# Patient Record
Sex: Female | Born: 1965 | Race: White | Hispanic: No | Smoking: Never smoker
Health system: Southern US, Community
[De-identification: ages and names within clinical notes are randomized; demographics above are authoritative.]

## PROBLEM LIST (undated history)

## (undated) DIAGNOSIS — F419 Anxiety disorder, unspecified: Secondary | ICD-10-CM

---

## 2020-10-04 ENCOUNTER — Emergency Department (HOSPITAL_BASED_OUTPATIENT_CLINIC_OR_DEPARTMENT_OTHER): Payer: Worker's Compensation

## 2020-10-04 ENCOUNTER — Emergency Department (HOSPITAL_BASED_OUTPATIENT_CLINIC_OR_DEPARTMENT_OTHER)
Admission: EM | Admit: 2020-10-04 | Discharge: 2020-10-04 | Disposition: A | Discharge status: Home / Self Care | Payer: Worker's Compensation | Attending: Emergency Medicine | Admitting: Emergency Medicine

## 2020-10-04 ENCOUNTER — Encounter (HOSPITAL_BASED_OUTPATIENT_CLINIC_OR_DEPARTMENT_OTHER): Payer: Self-pay | Admitting: Emergency Medicine

## 2020-10-04 DIAGNOSIS — M546 Pain in thoracic spine: Secondary | ICD-10-CM | POA: Insufficient documentation

## 2020-10-04 DIAGNOSIS — Y990 Civilian activity done for income or pay: Secondary | ICD-10-CM | POA: Insufficient documentation

## 2020-10-04 DIAGNOSIS — X509XXA Other and unspecified overexertion or strenuous movements or postures, initial encounter: Secondary | ICD-10-CM | POA: Insufficient documentation

## 2020-10-04 HISTORY — DX: Anxiety disorder, unspecified: F41.9

## 2020-10-04 MED ORDER — KETOROLAC TROMETHAMINE 60 MG/2ML IM SOLN
30.0000 mg | Freq: Once | INTRAMUSCULAR | Status: AC
  Administered 2020-10-04: 30 mg via INTRAMUSCULAR
  Filled 2020-10-04: qty 2

## 2020-10-04 MED ORDER — CYCLOBENZAPRINE HCL 10 MG PO TABS
10.0000 mg | ORAL_TABLET | Freq: Once | ORAL | Status: AC
  Administered 2020-10-04: 10 mg via ORAL
  Filled 2020-10-04: qty 1

## 2020-10-04 MED ORDER — CYCLOBENZAPRINE HCL 10 MG PO TABS: 10.0000 mg | ORAL_TABLET | Freq: Two times a day (BID) | ORAL | 0 refills | Status: AC | PRN

## 2020-10-04 MED ORDER — LIDOCAINE 5 % EX PTCH
1.0000 | MEDICATED_PATCH | CUTANEOUS | Status: DC
  Administered 2020-10-04: 1 via TRANSDERMAL
  Filled 2020-10-04: qty 1

## 2020-10-04 MED ORDER — ACETAMINOPHEN 500 MG PO TABS
1000.0000 mg | ORAL_TABLET | Freq: Once | ORAL | Status: AC
  Administered 2020-10-04: 1000 mg via ORAL
  Filled 2020-10-04: qty 2

## 2020-10-04 NOTE — ED Provider Notes (Signed)
[facility] EMERGENCY DEPT Provider Note   CSN: [id number] Arrival date & time: 10/04/20  1427     History Chief Complaint  Patient presents with   Back Pain    Cynthia Richmond is a 55 y.o. female.   Back Pain Associated symptoms: no abdominal pain, no chest pain, no dysuria, no fever, no headaches, no numbness and no weakness   Patient is a 56 year old female who presents for back pain.  She had an injury at work about 10 days ago.  At that time, she fell onto the left side of her thoracic back.  She had some bruising and abrasion at that time.  Earlier today, she was doing some physical labor she went to push herself up out of a ditch, simulating a push-up movement.  At that time, she felt multiple pops in the same area of her recent injury.  She has since had a sharp pain at that location.  Pain is worsened with deep inspiration.  She has not tried any new medication for her pain.  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  Patient denies any other areas of discomfort.      Past Medical History:  Diagnosis Date   Anxiety     There are no problems to display for this patient.    OB History   No obstetric history on file.     No family history on file.     Home Medications Prior to Admission medications   Medication Sig Start Date End Date Taking? Authorizing Provider  cyclobenzaprine (FLEXERIL) 10 MG tablet Take 1 tablet (10 mg total) by mouth 2 (two) times daily as needed for up to 3 days for muscle spasms. 10/04/20 10/07/20 Yes Gloris Manchester, MD    Allergies    Patient has no known allergies.  Review of Systems   Review of Systems  Constitutional:  Negative for chills and fever.  HENT:  Negative for ear pain and sore throat.   Eyes:  Negative for pain and visual disturbance.  Respiratory:  Negative for cough, wheezing and stridor.   Cardiovascular:  Negative for chest pain and palpitations.   Gastrointestinal:  Negative for abdominal pain and vomiting.  Genitourinary:  Negative for dysuria and hematuria.  Musculoskeletal:  Positive for back pain. Negative for arthralgias, gait problem, joint swelling and neck pain.  Skin:  Negative for color change, rash and wound.  Neurological:  Negative for dizziness, seizures, syncope, weakness, light-headedness, numbness and headaches.  All other systems reviewed and are negative.  Physical Exam Updated Vital Signs BP (!) 136/96 (BP Location: Left Arm)   Pulse 61   Temp 98 F (36.7 C) (Oral)   Resp 16   Ht 5\' 11"  (1.803 m)   Wt 77.1 kg   SpO2 100%   BMI 23.71 kg/m   Physical Exam Vitals and nursing note reviewed.  Constitutional:      General: She is not in acute distress.    Appearance: Normal appearance. She is well-developed and normal weight. She is not ill-appearing, toxic-appearing or diaphoretic.  HENT:     Head: Normocephalic and atraumatic.     Right Ear: External ear normal.     Left Ear: External ear normal.     Nose: Nose normal.  Eyes:     Conjunctiva/sclera: Conjunctivae normal.  Cardiovascular:     Rate and Rhythm: Normal rate and regular rhythm.     Heart sounds: No  murmur heard. Pulmonary:     Effort: Pulmonary effort is normal. No respiratory distress.     Breath sounds: Normal breath sounds. No wheezing or rhonchi.  Abdominal:     Palpations: Abdomen is soft.     Tenderness: There is no abdominal tenderness.  Musculoskeletal:        General: Tenderness (Area of injury (left lower thoracic)) present.     Cervical back: Normal range of motion and neck supple.  Skin:    General: Skin is warm and dry.     Coloration: Skin is not jaundiced or pale.  Neurological:     General: No focal deficit present.     Mental Status: She is alert and oriented to person, place, and time.     Cranial Nerves: No cranial nerve deficit.     Sensory: No sensory deficit.     Motor: No weakness.  Psychiatric:         Mood and Affect: Mood normal.        Behavior: Behavior normal.        Thought Content: Thought content normal.        Judgment: Judgment normal.    ED Results / Procedures / Treatments   Labs (all labs ordered are listed, but only abnormal results are displayed) Labs Reviewed - No data to display  EKG None  Radiology DG Chest 2 View  Result Date: 10/04/2020 CLINICAL DATA:  Felt a pop in thoracic area when bending down. Shortness of breath. EXAM: CHEST - 2 VIEW COMPARISON:  None. FINDINGS: The heart size and mediastinal contours are within normal limits. Both lungs are clear. The visualized skeletal structures are unremarkable. IMPRESSION: No active cardiopulmonary disease. Electronically Signed   By: Gerome Sam III M.D.   On: 10/04/2020 15:21    Procedures Procedures   Medications Ordered in ED Medications  ketorolac (TORADOL) injection 30 mg (30 mg Intramuscular Given 10/04/20 1953)  acetaminophen (TYLENOL) tablet 1,000 mg (1,000 mg Oral Given 10/04/20 1953)  cyclobenzaprine (FLEXERIL) tablet 10 mg (10 mg Oral Given 10/04/20 1953)    ED Course  I have reviewed the triage vital signs and the nursing notes.  Pertinent labs & imaging results that were available during my care of the patient were reviewed by me and considered in my medical decision making (see chart for details).    MDM Rules/Calculators/A&P                           Patient presents for left thoracic back pain.  She had a previous injury from a fall 10 days ago.  Today, she was doing a push-up movement while climbing out of a ditch and felt a pop in that same location.  She presents today for persistent pain throughout the day.  On exam, patient is well-appearing.  She is sitting on the edge of ED bed and restricting movement to avoid worsening of her thoracic left-sided back pain.  Area is tenderness to palpation.  No swelling is appreciated.  Lungs are clear to auscultation.  Prior to being bedded in the ED,  x-ray imaging was obtained which did not show any osseous abnormalities.  Patient's pain likely in muscles/connective tissue.  Will treat with analgesia.  Patient also requested a work note, as her job requires physically intensive labor.  Patient was given lidocaine patch, Flexeril, Toradol, and Tylenol.  Work note and prescription for Flexeril, to be taken as needed, were provided.  Patient was discharged in good condition.  Final Clinical Impression(s) / ED Diagnoses Final diagnoses:  Acute left-sided thoracic back pain    Rx / DC Orders ED Discharge Orders          Ordered    cyclobenzaprine (FLEXERIL) 10 MG tablet  2 times daily PRN        10/04/20 2010             Gloris Manchester, MD 10/05/20 0202

## 2020-10-04 NOTE — ED Triage Notes (Signed)
She bend down today and felt a pop to L thoracic back area and now reports SOB. Concerned about a cracked rib.

## 2021-12-12 IMAGING — DX DG CHEST 2V
2 series · 2 of 2 positions shown · non-contrast
Comparison: None.

CLINICAL DATA: Felt a pop in thoracic area when bending down.
Shortness of breath.

EXAM:
CHEST - 2 VIEW

[chest lat]
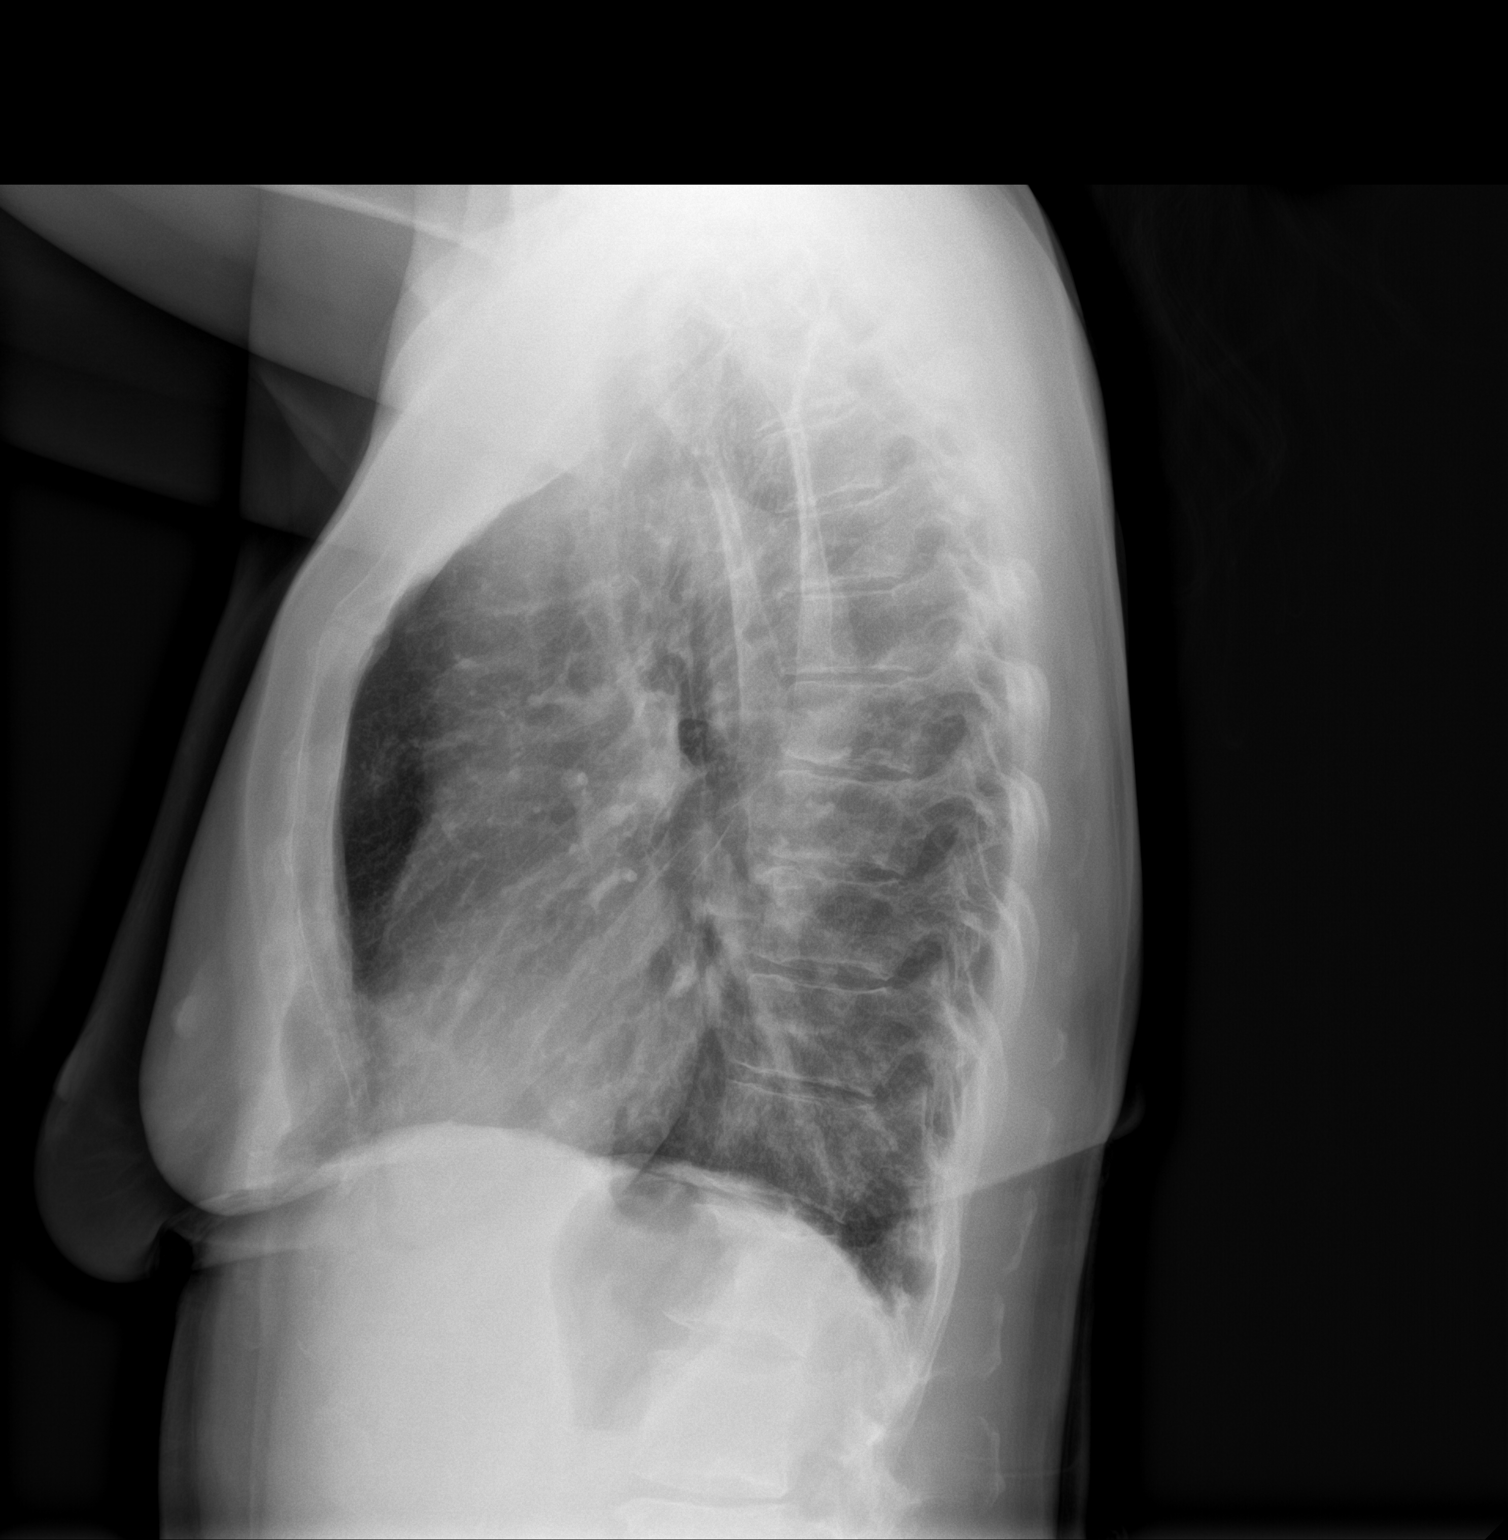

[chest ap]
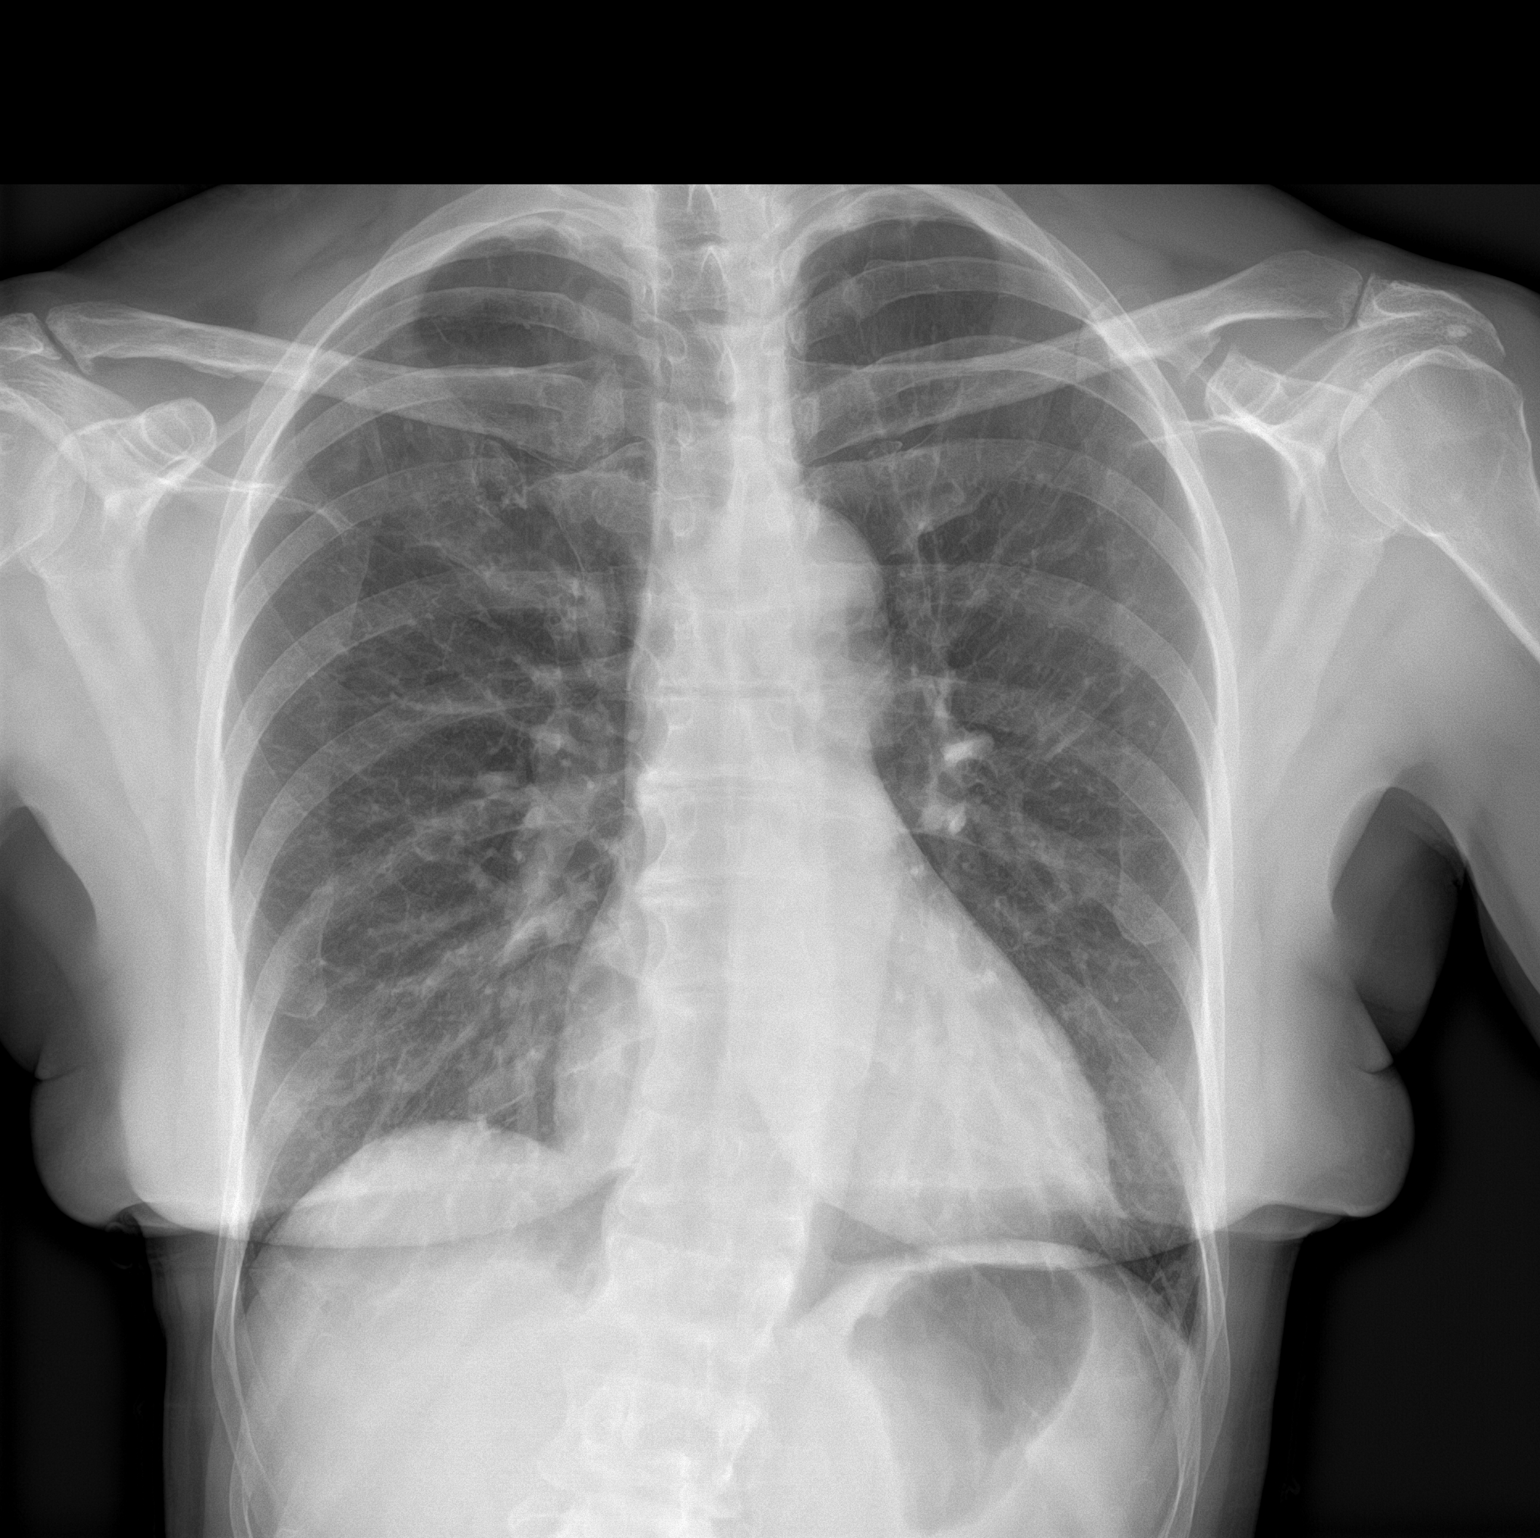

[2 of 2 positions shown; findings below may reference images not displayed]

FINDINGS: The heart size and mediastinal contours are within normal limits.
Both lungs are clear. The visualized skeletal structures are
unremarkable.
IMPRESSION: No active cardiopulmonary disease.

## 2022-01-14 ENCOUNTER — Other Ambulatory Visit (HOSPITAL_COMMUNITY): Payer: Self-pay

## 2022-01-14 ENCOUNTER — Other Ambulatory Visit: Payer: Self-pay

## 2022-01-14 ENCOUNTER — Emergency Department (HOSPITAL_COMMUNITY): Payer: BC Managed Care – PPO

## 2022-01-14 ENCOUNTER — Inpatient Hospital Stay (HOSPITAL_COMMUNITY)
Admission: EM | Admit: 2022-01-14 | Discharge: 2022-01-16 | DRG: 605 | Disposition: A | Discharge status: Home / Self Care | Payer: BC Managed Care – PPO | Attending: Surgery | Admitting: Surgery

## 2022-01-14 DIAGNOSIS — S300XXA Contusion of lower back and pelvis, initial encounter: Principal | ICD-10-CM | POA: Diagnosis present

## 2022-01-14 DIAGNOSIS — Z885 Allergy status to narcotic agent status: Secondary | ICD-10-CM | POA: Diagnosis not present

## 2022-01-14 DIAGNOSIS — W208XXA Other cause of strike by thrown, projected or falling object, initial encounter: Secondary | ICD-10-CM | POA: Diagnosis present

## 2022-01-14 DIAGNOSIS — S301XXA Contusion of abdominal wall, initial encounter: Secondary | ICD-10-CM | POA: Diagnosis present

## 2022-01-14 DIAGNOSIS — T148XXA Other injury of unspecified body region, initial encounter: Secondary | ICD-10-CM | POA: Diagnosis present

## 2022-01-14 LAB — CBC WITH DIFFERENTIAL/PLATELET
Abs Immature Granulocytes: 0.03 10*3/uL (ref 0.00–0.07)
Basophils Absolute: 0 10*3/uL (ref 0.0–0.1)
Basophils Relative: 0 %
Eosinophils Absolute: 0 10*3/uL (ref 0.0–0.5)
Eosinophils Relative: 0 %
HCT: 36.5 % (ref 36.0–46.0)
Hemoglobin: 12.5 g/dL (ref 12.0–15.0)
Immature Granulocytes: 0 %
Lymphocytes Relative: 7 %
Lymphs Abs: 0.7 10*3/uL (ref 0.7–4.0)
MCH: 32.6 pg (ref 26.0–34.0)
MCHC: 34.2 g/dL (ref 30.0–36.0)
MCV: 95.3 fL (ref 80.0–100.0)
Monocytes Absolute: 0.5 10*3/uL (ref 0.1–1.0)
Monocytes Relative: 5 %
Neutro Abs: 8.1 10*3/uL — ABNORMAL HIGH (ref 1.7–7.7)
Neutrophils Relative %: 88 %
Platelets: 184 10*3/uL (ref 150–400)
RBC: 3.83 MIL/uL — ABNORMAL LOW (ref 3.87–5.11)
RDW: 12.6 % (ref 11.5–15.5)
WBC: 9.4 10*3/uL (ref 4.0–10.5)
nRBC: 0 % (ref 0.0–0.2)

## 2022-01-14 LAB — COMPREHENSIVE METABOLIC PANEL
ALT: 17 U/L (ref 0–44)
AST: 22 U/L (ref 15–41)
Albumin: 3.9 g/dL (ref 3.5–5.0)
Alkaline Phosphatase: 51 U/L (ref 38–126)
Anion gap: 12 (ref 5–15)
BUN: 31 mg/dL — ABNORMAL HIGH (ref 6–20)
CO2: 24 mmol/L (ref 22–32)
Calcium: 9.2 mg/dL (ref 8.9–10.3)
Chloride: 104 mmol/L (ref 98–111)
Creatinine, Ser: 0.68 mg/dL (ref 0.44–1.00)
GFR, Estimated: >60 mL/min (ref >60)
Glucose, Bld: 142 mg/dL — ABNORMAL HIGH (ref 70–99)
Potassium: 3.6 mmol/L (ref 3.5–5.1)
Sodium: 140 mmol/L (ref 135–145)
Total Bilirubin: 0.6 mg/dL (ref 0.3–1.2)
Total Protein: 6.9 g/dL (ref 6.5–8.1)

## 2022-01-14 LAB — I-STAT CHEM 8, ED
BUN: 30 mg/dL — ABNORMAL HIGH (ref 6–20)
Calcium, Ion: 1.1 mmol/L — ABNORMAL LOW (ref 1.15–1.40)
Chloride: 106 mmol/L (ref 98–111)
Creatinine, Ser: 0.6 mg/dL (ref 0.44–1.00)
Glucose, Bld: 141 mg/dL — ABNORMAL HIGH (ref 70–99)
HCT: 39 % (ref 36.0–46.0)
Hemoglobin: 13.3 g/dL (ref 12.0–15.0)
Potassium: 3.6 mmol/L (ref 3.5–5.1)
Sodium: 139 mmol/L (ref 135–145)
TCO2: 21 mmol/L — ABNORMAL LOW (ref 22–32)

## 2022-01-14 MED ORDER — FENTANYL CITRATE PF 50 MCG/ML IJ SOSY: 50.0000 ug | PREFILLED_SYRINGE | Freq: Once | INTRAMUSCULAR | Status: DC

## 2022-01-14 MED ORDER — ONDANSETRON HCL 4 MG/2ML IJ SOLN: 4.0000 mg | Freq: Four times a day (QID) | INTRAMUSCULAR | Status: DC | PRN

## 2022-01-14 MED ORDER — ACETAMINOPHEN 500 MG PO TABS
1000.0000 mg | ORAL_TABLET | Freq: Four times a day (QID) | ORAL | Status: DC
  Administered 2022-01-15 – 2022-01-16 (×6): 1000 mg via ORAL
  Filled 2022-01-14 (×6): qty 2

## 2022-01-14 MED ORDER — SODIUM CHLORIDE 0.9 % IV BOLUS
1000.0000 mL | Freq: Once | INTRAVENOUS | Status: AC
  Administered 2022-01-14: 1000 mL via INTRAVENOUS

## 2022-01-14 MED ORDER — OXYCODONE HCL 5 MG PO TABS
5.0000 mg | ORAL_TABLET | ORAL | Status: DC | PRN
  Administered 2022-01-16: 5 mg via ORAL
  Filled 2022-01-14: qty 1

## 2022-01-14 MED ORDER — ONDANSETRON 4 MG PO TBDP: 4.0000 mg | ORAL_TABLET | Freq: Four times a day (QID) | ORAL | Status: DC | PRN

## 2022-01-14 MED ORDER — IOHEXOL 350 MG/ML SOLN
75.0000 mL | Freq: Once | INTRAVENOUS | Status: AC | PRN
  Administered 2022-01-14: 75 mL via INTRAVENOUS

## 2022-01-14 MED ORDER — HYDRALAZINE HCL 20 MG/ML IJ SOLN: 10.0000 mg | INTRAMUSCULAR | Status: DC | PRN

## 2022-01-14 MED ORDER — HYDROMORPHONE HCL 1 MG/ML IJ SOLN
1.0000 mg | Freq: Once | INTRAMUSCULAR | Status: AC
  Administered 2022-01-14: 1 mg via INTRAVENOUS
  Filled 2022-01-14: qty 1

## 2022-01-14 MED ORDER — ONDANSETRON HCL 4 MG/2ML IJ SOLN
4.0000 mg | Freq: Once | INTRAMUSCULAR | Status: AC
  Administered 2022-01-14: 4 mg via INTRAVENOUS
  Filled 2022-01-14: qty 2

## 2022-01-14 MED ORDER — OXYCODONE HCL 5 MG PO TABS
10.0000 mg | ORAL_TABLET | ORAL | Status: DC | PRN
  Administered 2022-01-15 – 2022-01-16 (×2): 10 mg via ORAL
  Filled 2022-01-14 (×2): qty 2

## 2022-01-14 MED ORDER — DOCUSATE SODIUM 100 MG PO CAPS
100.0000 mg | ORAL_CAPSULE | Freq: Two times a day (BID) | ORAL | Status: DC
  Administered 2022-01-15: 100 mg via ORAL
  Filled 2022-01-14 (×2): qty 1

## 2022-01-14 MED ORDER — DIAZEPAM 5 MG/ML IJ SOLN
5.0000 mg | Freq: Once | INTRAMUSCULAR | Status: AC
  Administered 2022-01-14: 5 mg via INTRAVENOUS
  Filled 2022-01-14: qty 2

## 2022-01-14 MED ORDER — MELATONIN 3 MG PO TABS
3.0000 mg | ORAL_TABLET | Freq: Every evening | ORAL | Status: DC | PRN
  Administered 2022-01-15: 3 mg via ORAL
  Filled 2022-01-14: qty 1

## 2022-01-14 MED ORDER — HYDROMORPHONE HCL 1 MG/ML IJ SOLN
0.5000 mg | INTRAMUSCULAR | Status: DC | PRN
  Administered 2022-01-15: 0.5 mg via INTRAVENOUS
  Filled 2022-01-14: qty 1

## 2022-01-14 NOTE — ED Notes (Signed)
Patient is resting comfortably. 

## 2022-01-14 NOTE — ED Notes (Signed)
Pt does not want to change into a gown at this time

## 2022-01-14 NOTE — ED Notes (Signed)
Patient allowed staff to remove bra and pants, placed gown on patient. Patient appears comfortable at this time. Patient updated about plan of care and NPO at this time.

## 2022-01-14 NOTE — ED Provider Notes (Signed)
[facility] EMERGENCY DEPARTMENT Provider Note   CSN: [id number] Arrival date & time: 01/14/22  1918     History  Chief Complaint  Patient presents with   Back Injury    Martena Emanuele is a 56 y.o. female here presenting with back injury.  Patient states that she had some people cut down trees at her house.  She states that one of the straps is loose so she went over to tell the workers and the [place] apparently broke and the branch fell and hit her on the right side of her back and ribs area.  She noticed progressive swelling to the right hip and back area.  She states that she is in so much pain that she eventually could not walk due to pain.  She states that she was able to walk initially after the injury.  Denies any head injury.  The history is provided by the patient.       Home Medications Prior to Admission medications   Not on File      Allergies    Hydrocodone, Codeine, and Povidone-iodine    Review of Systems   Review of Systems  Musculoskeletal:        Right flank pain  All other systems reviewed and are negative.   Physical Exam Updated Vital Signs BP 135/81   Pulse 73   Temp 98.7 F (37.1 C) (Oral)   Resp 18   Ht 5\' 11"  (1.803 m)   Wt 82.6 kg   SpO2 99%   BMI 25.38 kg/m  Physical Exam Vitals and nursing note reviewed.  Constitutional:      Comments: Uncomfortable,  HENT:     Head: Normocephalic and atraumatic.     Comments: No signs of head trauma    Nose: Nose normal.     Mouth/Throat:     Mouth: Mucous membranes are moist.  Eyes:     Extraocular Movements: Extraocular movements intact.     Pupils: Pupils are equal, round, and reactive to light.  Cardiovascular:     Rate and Rhythm: Normal rate and regular rhythm.     Pulses: Normal pulses.     Heart sounds: Normal heart sounds.  Pulmonary:     Effort: Pulmonary effort is normal.     Breath sounds: Normal breath sounds.     Comments: Tenderness in the right  lower ribs Abdominal:     General: Abdomen is flat.     Palpations: Abdomen is soft.  Musculoskeletal:     Cervical back: Normal range of motion and neck supple.     Comments: Patient has tenderness in the right gluteus area and also right flank area.  Patient has pain with range of motion of the hip but is able to range the hip.  Skin:    General: Skin is warm.     Capillary Refill: Capillary refill takes less than 2 seconds.  Neurological:     General: No focal deficit present.     Mental Status: She is oriented to person, place, and time.  Psychiatric:        Mood and Affect: Mood normal.        Behavior: Behavior normal.     ED Results / Procedures / Treatments   Labs (all labs ordered are listed, but only abnormal results are displayed) Labs Reviewed  CBC WITH DIFFERENTIAL/PLATELET - Abnormal; Notable for the following components:      Result Value   RBC 3.83 (*)  Neutro Abs 8.1 (*)    All other components within normal limits  COMPREHENSIVE METABOLIC PANEL - Abnormal; Notable for the following components:   Glucose, Bld 142 (*)    BUN 31 (*)    All other components within normal limits  I-STAT CHEM 8, ED - Abnormal; Notable for the following components:   BUN 30 (*)    Glucose, Bld 141 (*)    Calcium, Ion 1.10 (*)    TCO2 21 (*)    All other components within normal limits    EKG None  Radiology DG Pelvis Portable  Result Date: 01/14/2022 CLINICAL DATA:  Status post fall. EXAM: PORTABLE PELVIS 1-2 VIEWS COMPARISON:  None Available. FINDINGS: There is no evidence of an acute pelvic fracture or diastasis. A radiopaque fixation plate and screws are seen overlying the symphysis pubis. Large radiopaque surgical screws are seen overlying the sacrum. No pelvic bone lesions are seen. IMPRESSION: No acute osseous abnormality. Electronically Signed   By: Aram Candela M.D.   On: 01/14/2022 20:45   DG Chest Port 1 View  Result Date: 01/14/2022 CLINICAL DATA:   Status post fall. EXAM: PORTABLE CHEST 1 VIEW COMPARISON:  October 04, 2020 FINDINGS: The heart size and mediastinal contours are within normal limits. Both lungs are clear. The visualized skeletal structures are unremarkable. IMPRESSION: No active disease. Electronically Signed   By: Aram Candela M.D.   On: 01/14/2022 20:44    Procedures Procedures    CRITICAL CARE Performed by: Richardean Canal   Total critical care time: 30 minutes  Critical care time was exclusive of separately billable procedures and treating other patients.  Critical care was necessary to treat or prevent imminent or life-threatening deterioration.  Critical care was time spent personally by me on the following activities: development of treatment plan with patient and/or surrogate as well as nursing, discussions with consultants, evaluation of patient's response to treatment, examination of patient, obtaining history from patient or surrogate, ordering and performing treatments and interventions, ordering and review of laboratory studies, ordering and review of radiographic studies, pulse oximetry and re-evaluation of patient's condition.   Medications Ordered in ED Medications  ondansetron (ZOFRAN) injection 4 mg (4 mg Intravenous Given 01/14/22 1947)  sodium chloride 0.9 % bolus 1,000 mL (0 mLs Intravenous Stopped 01/14/22 2227)  HYDROmorphone (DILAUDID) injection 1 mg (1 mg Intravenous Given 01/14/22 1947)  diazepam (VALIUM) injection 5 mg (5 mg Intravenous Given 01/14/22 1947)  iohexol (OMNIPAQUE) 350 MG/ML injection 75 mL (75 mLs Intravenous Contrast Given 01/14/22 2213)    ED Course/ Medical Decision Making/ A&P                           Medical Decision Making Shaylan Tutton is a 56 y.o. female here presenting with injury to the right flank and chest area.  Concern for possible contusion versus rib fracture versus kidney contusion.  Plan to get CBC and CMP and CT chest abdomen pelvis.  Will give  pain medicine and reassess  11:07 PM CT showed right gluteal hematoma with active extravasation.  I consulted Dr. Cliffton Asters from trauma surgery who will see patient.  Likely patient will need admission for pain control.  Patient is still not able to ambulate despite pain meds.  Problems Addressed: Hematoma: acute illness or injury  Amount and/or Complexity of Data Reviewed Labs: ordered. Decision-making details documented in ED Course. Radiology: ordered and independent interpretation performed. Decision-making details documented in ED  Course.  Risk Prescription drug management. Decision regarding hospitalization.    Final Clinical Impression(s) / ED Diagnoses Final diagnoses:  None    Rx / DC Orders ED Discharge Orders     None         Charlynne Pander, MD 01/14/22 2308

## 2022-01-14 NOTE — H&P (Signed)
CC: struck by tree branch  HPI: Cynthia Richmond is an 56 y.o. female who presented the emergency department today after sustaining an injury where a tree branch struck her in the buttock.  She states that she had some people over to cut down some trees at her house and she noticed one of the straps was loose under branches so she went over to tell the workers and a branch subsequently fell and hit her on the right back and buttock area.  Progress is full and then developed and she had some significant pain when ambulating from the buttock.  No loss of consciousness.  Underwent workup in the emergency department we are subsequently asked to see.  Past Medical History:  Diagnosis Date   Anxiety       No family history on file.  Social:  has no history on file for tobacco use, alcohol use, and drug use.  Allergies:  Allergies  Allergen Reactions   Hydrocodone Itching   Codeine Itching and Other (See Comments)    Other reaction(s): Unknown  Other reaction(s): Itching    Medications: I have reviewed the patient's current medications.  Results for orders placed or performed during the hospital encounter of 01/14/22 (from the past 48 hour(s))  CBC with Differential     Status: Abnormal   Collection Time: 01/14/22  7:36 PM  Result Value Ref Range   WBC 9.4 4.0 - 10.5 K/uL   RBC 3.83 (L) 3.87 - 5.11 MIL/uL   Hemoglobin 12.5 12.0 - 15.0 g/dL   HCT 07.6 22.6 - 33.3 %   MCV 95.3 80.0 - 100.0 fL   MCH 32.6 26.0 - 34.0 pg   MCHC 34.2 30.0 - 36.0 g/dL   RDW 54.5 62.5 - 63.8 %   Platelets 184 150 - 400 K/uL   nRBC 0.0 0.0 - 0.2 %   Neutrophils Relative % 88 %   Neutro Abs 8.1 (H) 1.7 - 7.7 K/uL   Lymphocytes Relative 7 %   Lymphs Abs 0.7 0.7 - 4.0 K/uL   Monocytes Relative 5 %   Monocytes Absolute 0.5 0.1 - 1.0 K/uL   Eosinophils Relative 0 %   Eosinophils Absolute 0.0 0.0 - 0.5 K/uL   Basophils Relative 0 %   Basophils Absolute 0.0 0.0 - 0.1 K/uL   Immature  Granulocytes 0 %   Abs Immature Granulocytes 0.03 0.00 - 0.07 K/uL    Comment: Performed at [facility], [address] [place], [place] [address]  Comprehensive metabolic panel     Status: Abnormal   Collection Time: 01/14/22  7:36 PM  Result Value Ref Range   Sodium 140 135 - 145 mmol/L   Potassium 3.6 3.5 - 5.1 mmol/L   Chloride 104 98 - 111 mmol/L   CO2 24 22 - 32 mmol/L   Glucose, Bld 142 (H) 70 - 99 mg/dL    Comment: Glucose reference range applies only to samples taken after fasting for at least 8 hours.   BUN 31 (H) 6 - 20 mg/dL   Creatinine, Ser 2.87 0.44 - 1.00 mg/dL   Calcium 9.2 8.9 - 68.1 mg/dL   Total Protein 6.9 6.5 - 8.1 g/dL   Albumin 3.9 3.5 - 5.0 g/dL   AST 22 15 - 41 U/L   ALT 17 0 - 44 U/L   Alkaline Phosphatase 51 38 - 126 U/L   Total Bilirubin 0.6 0.3 - 1.2 mg/dL   GFR, Estimated >15 >  60 mL/min    Comment: (NOTE) Calculated using the CKD-EPI Creatinine Equation (2021)    Anion gap 12 5 - 15    Comment: Performed at [facility], [address], [place], [place] [address]  I-stat chem 8, ED (not at [facility] or [facility])     Status: Abnormal   Collection Time: 01/14/22  7:45 PM  Result Value Ref Range   Sodium 139 135 - 145 mmol/L   Potassium 3.6 3.5 - 5.1 mmol/L   Chloride 106 98 - 111 mmol/L   BUN 30 (H) 6 - 20 mg/dL   Creatinine, Ser 3.53 0.44 - 1.00 mg/dL   Glucose, Bld 299 (H) 70 - 99 mg/dL    Comment: Glucose reference range applies only to samples taken after fasting for at least 8 hours.   Calcium, Ion 1.10 (L) 1.15 - 1.40 mmol/L   TCO2 21 (L) 22 - 32 mmol/L   Hemoglobin 13.3 12.0 - 15.0 g/dL   HCT 24.2 68.3 - 41.9 %    CT CHEST ABDOMEN PELVIS W CONTRAST  Result Date: 01/14/2022 CLINICAL DATA:  Blunt poly trauma. Patient states she was cutting trees around 1,500 today and tree fell and landed on her back. EXAM: CT CHEST, ABDOMEN, AND PELVIS WITH CONTRAST TECHNIQUE: Multidetector CT imaging of the chest, abdomen and pelvis was performed  following the standard protocol during bolus administration of intravenous contrast. RADIATION DOSE REDUCTION: This exam was performed according to the departmental dose-optimization program which includes automated exposure control, adjustment of the mA and/or kV according to patient size and/or use of iterative reconstruction technique. CONTRAST:  90mL OMNIPAQUE IOHEXOL 350 MG/ML SOLN COMPARISON:  None Available. FINDINGS: CT CHEST FINDINGS Cardiovascular: No significant vascular findings. Normal heart size. No pericardial effusion. Mediastinum/Nodes: No enlarged mediastinal, hilar, or axillary lymph nodes. Thyroid gland, trachea, and esophagus demonstrate no significant findings. Lungs/Pleura: Lungs are clear. No pleural effusion or pneumothorax. Musculoskeletal: No chest wall mass or suspicious bone lesions identified. CT ABDOMEN PELVIS FINDINGS Hepatobiliary: No focal liver abnormality is seen. No gallstones, gallbladder wall thickening, or biliary dilatation. Pancreas: Unremarkable. No pancreatic ductal dilatation or surrounding inflammatory changes. Spleen: Normal in size without focal abnormality. Adrenals/Urinary Tract: No adrenal hemorrhage or renal injury identified. Bladder is unremarkable. Simple cysts in the lower pole of the right kidney and interpolar region of the left kidney. Stomach/Bowel: Stomach is within normal limits. Appendix appears normal. No evidence of bowel wall thickening, distention, or inflammatory changes. Vascular/Lymphatic: No significant vascular findings are present. No enlarged abdominal or pelvic lymph nodes. Reproductive: Uterus and bilateral adnexa are unremarkable. Other: No abdominal wall hernia or abnormality. No abdominopelvic ascites. Musculoskeletal: There is a large subcutaneous hematoma in the right gluteal region measuring at least 11.4 x 4.5 x 12.0 cm. There is heterogeneity of this hematoma with trace amount of contrast in the hematoma suggesting active  extravasation. Subcutaneous hematoma along the right lateral abdominal wall measuring a proximally 3.9 x 3.3 x 10.6 cm. No acute fracture or dislocation. Hardware of the right sacroiliac joint and pubic symphysis is intact. Dextroscoliosis of the lumbar spine centered at L3 vertebral body. IMPRESSION: 1. Large subcutaneous hematoma in the right gluteal region measuring at least 11.4 x 4.5 x 12.0 cm. There is heterogeneity of this hematoma with trace amount of contrast in the hematoma suggesting active extravasation. Surgical consultation for further management is suggested. 2. Subcutaneous hematoma along the right lateral abdominal wall measuring 3.9 x 3.3 x 10.6 cm. 3. No acute fracture  or dislocation. 4. No evidence of acute intrathoracic, abdominal/pelvic visceral or vascular injury. 5. Dextroscoliosis of the lumbar spine centered at L3 vertebral body. Above findings and recommendations were reported to Dr. Silverio LayYao At approximately 10:57 p.m. on 01/14/2022. Electronically Signed   By: Larose HiresImran  Ahmed D.O.   On: 01/14/2022 22:59   DG Pelvis Portable  Result Date: 01/14/2022 CLINICAL DATA:  Status post fall. EXAM: PORTABLE PELVIS 1-2 VIEWS COMPARISON:  None Available. FINDINGS: There is no evidence of an acute pelvic fracture or diastasis. A radiopaque fixation plate and screws are seen overlying the symphysis pubis. Large radiopaque surgical screws are seen overlying the sacrum. No pelvic bone lesions are seen. IMPRESSION: No acute osseous abnormality. Electronically Signed   By: Aram Candelahaddeus  Houston M.D.   On: 01/14/2022 20:45   DG Chest Port 1 View  Result Date: 01/14/2022 CLINICAL DATA:  Status post fall. EXAM: PORTABLE CHEST 1 VIEW COMPARISON:  October 04, 2020 FINDINGS: The heart size and mediastinal contours are within normal limits. Both lungs are clear. The visualized skeletal structures are unremarkable. IMPRESSION: No active disease. Electronically Signed   By: Aram Candelahaddeus  Houston M.D.   On: 01/14/2022  20:44    ROS - all of the below systems have been reviewed with the patient and positives are indicated with bold text General: chills, fever or night sweats Eyes: blurry vision or double vision ENT: epistaxis or sore throat Allergy/Immunology: itchy/watery eyes or nasal congestion Hematologic/Lymphatic: bleeding problems, blood clots or swollen lymph nodes Endocrine: temperature intolerance or unexpected weight changes Breast: new or changing breast lumps or nipple discharge Resp: cough, shortness of breath, or wheezing CV: chest pain or dyspnea on exertion GI: as per HPI GU: dysuria, trouble voiding, or hematuria MSK: joint pain or joint stiffness Neuro: TIA or stroke symptoms Derm: pruritus and skin lesion changes Psych: anxiety and depression  PE Blood pressure 102/64, pulse 63, temperature 98.2 F (36.8 C), temperature source Oral, resp. rate 16, height 5\' 11"  (1.803 m), weight 82.6 kg, SpO2 99 %. Physical Exam Constitutional: NAD; conversant; no deformities Eyes: Moist conjunctiva; no lid lag; anicteric Neck: Trachea midline Lungs: Normal respiratory effort CV: RRR; no pitting edema GI: Abd soft, NT/ND MSK: Normal range of motion of extremities Psychiatric: Appropriate affect; alert and oriented x3  Results for orders placed or performed during the hospital encounter of 01/14/22 (from the past 48 hour(s))  CBC with Differential     Status: Abnormal   Collection Time: 01/14/22  7:36 PM  Result Value Ref Range   WBC 9.4 4.0 - 10.5 K/uL   RBC 3.83 (L) 3.87 - 5.11 MIL/uL   Hemoglobin 12.5 12.0 - 15.0 g/dL   HCT 40.936.5 81.136.0 - 91.446.0 %   MCV 95.3 80.0 - 100.0 fL   MCH 32.6 26.0 - 34.0 pg   MCHC 34.2 30.0 - 36.0 g/dL   RDW 78.212.6 95.611.5 - 21.315.5 %   Platelets 184 150 - 400 K/uL   nRBC 0.0 0.0 - 0.2 %   Neutrophils Relative % 88 %   Neutro Abs 8.1 (H) 1.7 - 7.7 K/uL   Lymphocytes Relative 7 %   Lymphs Abs 0.7 0.7 - 4.0 K/uL   Monocytes Relative 5 %   Monocytes Absolute 0.5 0.1  - 1.0 K/uL   Eosinophils Relative 0 %   Eosinophils Absolute 0.0 0.0 - 0.5 K/uL   Basophils Relative 0 %   Basophils Absolute 0.0 0.0 - 0.1 K/uL   Immature Granulocytes 0 %   Abs  Immature Granulocytes 0.03 0.00 - 0.07 K/uL    Comment: Performed at [facility], [address], [place], [place] [address]  Comprehensive metabolic panel     Status: Abnormal   Collection Time: 01/14/22  7:36 PM  Result Value Ref Range   Sodium 140 135 - 145 mmol/L   Potassium 3.6 3.5 - 5.1 mmol/L   Chloride 104 98 - 111 mmol/L   CO2 24 22 - 32 mmol/L   Glucose, Bld 142 (H) 70 - 99 mg/dL    Comment: Glucose reference range applies only to samples taken after fasting for at least 8 hours.   BUN 31 (H) 6 - 20 mg/dL   Creatinine, Ser 9.37 0.44 - 1.00 mg/dL   Calcium 9.2 8.9 - 16.9 mg/dL   Total Protein 6.9 6.5 - 8.1 g/dL   Albumin 3.9 3.5 - 5.0 g/dL   AST 22 15 - 41 U/L   ALT 17 0 - 44 U/L   Alkaline Phosphatase 51 38 - 126 U/L   Total Bilirubin 0.6 0.3 - 1.2 mg/dL   GFR, Estimated >67 >89 mL/min    Comment: (NOTE) Calculated using the CKD-EPI Creatinine Equation (2021)    Anion gap 12 5 - 15    Comment: Performed at [facility], [address] [place], [place] [address]  I-stat chem 8, ED (not at [facility] or [facility])     Status: Abnormal   Collection Time: 01/14/22  7:45 PM  Result Value Ref Range   Sodium 139 135 - 145 mmol/L   Potassium 3.6 3.5 - 5.1 mmol/L   Chloride 106 98 - 111 mmol/L   BUN 30 (H) 6 - 20 mg/dL   Creatinine, Ser 7.51 0.44 - 1.00 mg/dL   Glucose, Bld 025 (H) 70 - 99 mg/dL    Comment: Glucose reference range applies only to samples taken after fasting for at least 8 hours.   Calcium, Ion 1.10 (L) 1.15 - 1.40 mmol/L   TCO2 21 (L) 22 - 32 mmol/L   Hemoglobin 13.3 12.0 - 15.0 g/dL   HCT 85.2 77.8 - 24.2 %    CT CHEST ABDOMEN PELVIS W CONTRAST  Result Date: 01/14/2022 CLINICAL DATA:  Blunt poly trauma. Patient states she was cutting trees around 1,500 today and tree  fell and landed on her back. EXAM: CT CHEST, ABDOMEN, AND PELVIS WITH CONTRAST TECHNIQUE: Multidetector CT imaging of the chest, abdomen and pelvis was performed following the standard protocol during bolus administration of intravenous contrast. RADIATION DOSE REDUCTION: This exam was performed according to the departmental dose-optimization program which includes automated exposure control, adjustment of the mA and/or kV according to patient size and/or use of iterative reconstruction technique. CONTRAST:  39mL OMNIPAQUE IOHEXOL 350 MG/ML SOLN COMPARISON:  None Available. FINDINGS: CT CHEST FINDINGS Cardiovascular: No significant vascular findings. Normal heart size. No pericardial effusion. Mediastinum/Nodes: No enlarged mediastinal, hilar, or axillary lymph nodes. Thyroid gland, trachea, and esophagus demonstrate no significant findings. Lungs/Pleura: Lungs are clear. No pleural effusion or pneumothorax. Musculoskeletal: No chest wall mass or suspicious bone lesions identified. CT ABDOMEN PELVIS FINDINGS Hepatobiliary: No focal liver abnormality is seen. No gallstones, gallbladder wall thickening, or biliary dilatation. Pancreas: Unremarkable. No pancreatic ductal dilatation or surrounding inflammatory changes. Spleen: Normal in size without focal abnormality. Adrenals/Urinary Tract: No adrenal hemorrhage or renal injury identified. Bladder is unremarkable. Simple cysts in the lower pole of the right kidney and interpolar region of the left kidney. Stomach/Bowel: Stomach is within normal  limits. Appendix appears normal. No evidence of bowel wall thickening, distention, or inflammatory changes. Vascular/Lymphatic: No significant vascular findings are present. No enlarged abdominal or pelvic lymph nodes. Reproductive: Uterus and bilateral adnexa are unremarkable. Other: No abdominal wall hernia or abnormality. No abdominopelvic ascites. Musculoskeletal: There is a large subcutaneous hematoma in the right gluteal  region measuring at least 11.4 x 4.5 x 12.0 cm. There is heterogeneity of this hematoma with trace amount of contrast in the hematoma suggesting active extravasation. Subcutaneous hematoma along the right lateral abdominal wall measuring a proximally 3.9 x 3.3 x 10.6 cm. No acute fracture or dislocation. Hardware of the right sacroiliac joint and pubic symphysis is intact. Dextroscoliosis of the lumbar spine centered at L3 vertebral body. IMPRESSION: 1. Large subcutaneous hematoma in the right gluteal region measuring at least 11.4 x 4.5 x 12.0 cm. There is heterogeneity of this hematoma with trace amount of contrast in the hematoma suggesting active extravasation. Surgical consultation for further management is suggested. 2. Subcutaneous hematoma along the right lateral abdominal wall measuring 3.9 x 3.3 x 10.6 cm. 3. No acute fracture or dislocation. 4. No evidence of acute intrathoracic, abdominal/pelvic visceral or vascular injury. 5. Dextroscoliosis of the lumbar spine centered at L3 vertebral body. Above findings and recommendations were reported to Dr. Silverio Lay At approximately 10:57 p.m. on 01/14/2022. Electronically Signed   By: Larose Hires D.O.   On: 01/14/2022 22:59   DG Pelvis Portable  Result Date: 01/14/2022 CLINICAL DATA:  Status post fall. EXAM: PORTABLE PELVIS 1-2 VIEWS COMPARISON:  None Available. FINDINGS: There is no evidence of an acute pelvic fracture or diastasis. A radiopaque fixation plate and screws are seen overlying the symphysis pubis. Large radiopaque surgical screws are seen overlying the sacrum. No pelvic bone lesions are seen. IMPRESSION: No acute osseous abnormality. Electronically Signed   By: Aram Candela M.D.   On: 01/14/2022 20:45   DG Chest Port 1 View  Result Date: 01/14/2022 CLINICAL DATA:  Status post fall. EXAM: PORTABLE CHEST 1 VIEW COMPARISON:  October 04, 2020 FINDINGS: The heart size and mediastinal contours are within normal limits. Both lungs are clear. The  visualized skeletal structures are unremarkable. IMPRESSION: No active disease. Electronically Signed   By: Aram Candela M.D.   On: 01/14/2022 20:44      Assessment/Plan: 56yoF s/p struck by tree branch  11 x 5 x 12 cm hematoma right gluteal region - possible extravasation - suspect self limited; admit for further monitoring, repeat labs in AM, therapies Abd wall hematoma  - same as above   I spent a total of 55 minutes in both face-to-face and non-face-to-face activities, excluding procedures performed, for this visit on the date of this encounter.  Marin Olp, MD [facility], [facility]

## 2022-01-14 NOTE — ED Notes (Signed)
Pt desatting on room air after meds - placed on 3L [place] with improvement - MD notified

## 2022-01-14 NOTE — ED Triage Notes (Addendum)
Pt BIB EMS from home. Around 3pm pt was cutting trees, tree fell onto barn and rolled off and hit pt. Pt reports no LOC and was able to get up on her own. Pain has gotten increasingly worse. Pain from right mid back around to front of hip. Bruising and abrasions to right back side of body. VSS with EMS  fentanyl IM en route

## 2022-01-14 NOTE — ED Notes (Signed)
Dr. Yao at bedside. 

## 2022-01-14 NOTE — ED Notes (Signed)
Pt rolled with both primary RN and Dr Silverio Lay - Pt placed into gown at this time

## 2022-01-15 ENCOUNTER — Encounter (HOSPITAL_COMMUNITY): Payer: Self-pay

## 2022-01-15 LAB — CBC
HCT: 35.6 % — ABNORMAL LOW (ref 36.0–46.0)
Hemoglobin: 11.5 g/dL — ABNORMAL LOW (ref 12.0–15.0)
MCH: 31.9 pg (ref 26.0–34.0)
MCHC: 32.3 g/dL (ref 30.0–36.0)
MCV: 98.9 fL (ref 80.0–100.0)
Platelets: 176 10*3/uL (ref 150–400)
RBC: 3.6 MIL/uL — ABNORMAL LOW (ref 3.87–5.11)
RDW: 12.8 % (ref 11.5–15.5)
WBC: 6.4 10*3/uL (ref 4.0–10.5)
nRBC: 0 % (ref 0.0–0.2)

## 2022-01-15 MED ORDER — METHOCARBAMOL 500 MG PO TABS
500.0000 mg | ORAL_TABLET | Freq: Four times a day (QID) | ORAL | Status: DC
  Administered 2022-01-15 – 2022-01-16 (×5): 500 mg via ORAL
  Filled 2022-01-15 (×5): qty 1

## 2022-01-15 MED ORDER — SODIUM CHLORIDE 0.9 % IV SOLN: INTRAVENOUS | Status: DC

## 2022-01-15 MED ORDER — SODIUM CHLORIDE 0.9 % IV BOLUS
500.0000 mL | Freq: Once | INTRAVENOUS | Status: AC
  Administered 2022-01-15: 500 mL via INTRAVENOUS

## 2022-01-15 NOTE — ED Notes (Signed)
Pt denies feeling dizzy when standing with orthostatics, but does state she felt slightly light headed. Will notify admitting team.

## 2022-01-15 NOTE — Progress Notes (Signed)
Progress Note     Subjective: Still having pain that improves with IV pain medication. BP has been on lower side but she worked well with therapies. Tolerating diet   Objective: Vital signs in last 24 hours: Temp:  [97.7 F (36.5 C)-98.7 F (37.1 C)] 98 F (36.7 C) (11/25 0820) Pulse Rate:  [53-79] 65 (11/25 0818) Resp:  [12-20] 20 (11/25 0818) BP: (92-135)/(59-84) 92/59 (11/25 0818) SpO2:  [74 %-100 %] 98 % (11/25 0818) Weight:  [82.6 kg] 82.6 kg (11/24 1926)    Intake/Output from previous day: 11/24 0701 - 11/25 0700 In: 1000 [IV Piggyback:1000] Out: -  Intake/Output this shift: No intake/output data recorded.  PE: General: pleasant, WD, female who is laying in bed in NAD HEENT: head is normocephalic, atraumatic.  Sclera are noninjected.  Pupils equal and round. EOMs intact.  Ears and nose without any masses or lesions.  Mouth is pink and moist Heart: regular, rate, and rhythm.  Normal s1,s2. No obvious murmurs, gallops, or rubs noted.  Palpable radial and pedal pulses bilaterally Lungs: CTAB, no wheezes, rhonchi, or rales noted.  Respiratory effort nonlabored Abd: soft, NT, ND MSK: MAE. Right buttock hematoma soft Skin: warm and dry Neuro: Cranial nerves 2-12 grossly intact, sensation is normal throughout Psych: A&Ox3 with an appropriate affect.    Lab Results:  Recent Labs    01/14/22 1936 01/14/22 1945 01/15/22 0619  WBC 9.4  --  6.4  HGB 12.5 13.3 11.5*  HCT 36.5 39.0 35.6*  PLT 184  --  176   BMET Recent Labs    01/14/22 1936 01/14/22 1945  NA 140 139  K 3.6 3.6  CL 104 106  CO2 24  --   GLUCOSE 142* 141*  BUN 31* 30*  CREATININE 0.68 0.60  CALCIUM 9.2  --    PT/INR No results for input(s): "LABPROT", "INR" in the last 72 hours. CMP     Component Value Date/Time   NA 139 01/14/2022 1945   K 3.6 01/14/2022 1945   CL 106 01/14/2022 1945   CO2 24 01/14/2022 1936   GLUCOSE 141 (H) 01/14/2022 1945   BUN 30 (H) 01/14/2022 1945    CREATININE 0.60 01/14/2022 1945   CALCIUM 9.2 01/14/2022 1936   PROT 6.9 01/14/2022 1936   ALBUMIN 3.9 01/14/2022 1936   AST 22 01/14/2022 1936   ALT 17 01/14/2022 1936   ALKPHOS 51 01/14/2022 1936   BILITOT 0.6 01/14/2022 1936   GFRNONAA >60 01/14/2022 1936   Lipase  No results found for: "LIPASE"     Studies/Results: CT CHEST ABDOMEN PELVIS W CONTRAST  Result Date: 01/14/2022 CLINICAL DATA:  Blunt poly trauma. Patient states she was cutting trees around 1,500 today and tree fell and landed on her back. EXAM: CT CHEST, ABDOMEN, AND PELVIS WITH CONTRAST TECHNIQUE: Multidetector CT imaging of the chest, abdomen and pelvis was performed following the standard protocol during bolus administration of intravenous contrast. RADIATION DOSE REDUCTION: This exam was performed according to the departmental dose-optimization program which includes automated exposure control, adjustment of the mA and/or kV according to patient size and/or use of iterative reconstruction technique. CONTRAST:  33mL OMNIPAQUE IOHEXOL 350 MG/ML SOLN COMPARISON:  None Available. FINDINGS: CT CHEST FINDINGS Cardiovascular: No significant vascular findings. Normal heart size. No pericardial effusion. Mediastinum/Nodes: No enlarged mediastinal, hilar, or axillary lymph nodes. Thyroid gland, trachea, and esophagus demonstrate no significant findings. Lungs/Pleura: Lungs are clear. No pleural effusion or pneumothorax. Musculoskeletal: No chest wall mass or suspicious  bone lesions identified. CT ABDOMEN PELVIS FINDINGS Hepatobiliary: No focal liver abnormality is seen. No gallstones, gallbladder wall thickening, or biliary dilatation. Pancreas: Unremarkable. No pancreatic ductal dilatation or surrounding inflammatory changes. Spleen: Normal in size without focal abnormality. Adrenals/Urinary Tract: No adrenal hemorrhage or renal injury identified. Bladder is unremarkable. Simple cysts in the lower pole of the right kidney and  interpolar region of the left kidney. Stomach/Bowel: Stomach is within normal limits. Appendix appears normal. No evidence of bowel wall thickening, distention, or inflammatory changes. Vascular/Lymphatic: No significant vascular findings are present. No enlarged abdominal or pelvic lymph nodes. Reproductive: Uterus and bilateral adnexa are unremarkable. Other: No abdominal wall hernia or abnormality. No abdominopelvic ascites. Musculoskeletal: There is a large subcutaneous hematoma in the right gluteal region measuring at least 11.4 x 4.5 x 12.0 cm. There is heterogeneity of this hematoma with trace amount of contrast in the hematoma suggesting active extravasation. Subcutaneous hematoma along the right lateral abdominal wall measuring a proximally 3.9 x 3.3 x 10.6 cm. No acute fracture or dislocation. Hardware of the right sacroiliac joint and pubic symphysis is intact. Dextroscoliosis of the lumbar spine centered at L3 vertebral body. IMPRESSION: 1. Large subcutaneous hematoma in the right gluteal region measuring at least 11.4 x 4.5 x 12.0 cm. There is heterogeneity of this hematoma with trace amount of contrast in the hematoma suggesting active extravasation. Surgical consultation for further management is suggested. 2. Subcutaneous hematoma along the right lateral abdominal wall measuring 3.9 x 3.3 x 10.6 cm. 3. No acute fracture or dislocation. 4. No evidence of acute intrathoracic, abdominal/pelvic visceral or vascular injury. 5. Dextroscoliosis of the lumbar spine centered at L3 vertebral body. Above findings and recommendations were reported to Dr. Silverio Lay At approximately 10:57 p.m. on 01/14/2022. Electronically Signed   By: Larose Hires D.O.   On: 01/14/2022 22:59   DG Pelvis Portable  Result Date: 01/14/2022 CLINICAL DATA:  Status post fall. EXAM: PORTABLE PELVIS 1-2 VIEWS COMPARISON:  None Available. FINDINGS: There is no evidence of an acute pelvic fracture or diastasis. A radiopaque fixation plate  and screws are seen overlying the symphysis pubis. Large radiopaque surgical screws are seen overlying the sacrum. No pelvic bone lesions are seen. IMPRESSION: No acute osseous abnormality. Electronically Signed   By: Aram Candela M.D.   On: 01/14/2022 20:45   DG Chest Port 1 View  Result Date: 01/14/2022 CLINICAL DATA:  Status post fall. EXAM: PORTABLE CHEST 1 VIEW COMPARISON:  October 04, 2020 FINDINGS: The heart size and mediastinal contours are within normal limits. Both lungs are clear. The visualized skeletal structures are unremarkable. IMPRESSION: No active disease. Electronically Signed   By: Aram Candela M.D.   On: 01/14/2022 20:44    Anti-infectives: Anti-infectives (From admission, onward)    None        Assessment/Plan 56yoF s/p struck by tree branch   11 x 5 x 12 cm hematoma right gluteal region - possible extravasation - suspect self limited, Hgb 11.5 from 13.3. has worked well with therapies Abd wall hematoma  - same as above. Tolerating diet Soft BP - ordered orthostatics which were negative but patient light headed with standing. Ordered 500 ml NS bolus  Dispo: patient hoping to go home today - her significant other can assist her at home. Did well with OT. Await PT reccs. Given bolus for BP. Possible dc in next 24 to 48 hours pending BP and hgb  FEN: regular ID: none indicated VTE: SCDs, hold chemical ppx due  to bleeding risk  I reviewed nursing notes, last 24 h vitals and pain scores, last 48 h intake and output, last 24 h labs and trends, and last 24 h imaging results.    LOS: 1 day   Eric Form, [facility] Surgery 01/15/2022, 8:39 AM Please see Amion for pager number during day hours 7:00am-4:30pm

## 2022-01-15 NOTE — ED Notes (Signed)
Report given to Anna RN.

## 2022-01-15 NOTE — Evaluation (Signed)
Physical Therapy Evaluation Patient Details Name: Cynthia Richmond MRN: [id number] DOB: [birth year] Today's Date: 01/15/2022  History of Present Illness  Cynthia Richmond is an 56 y.o. female who presented after sustaining an injury where a tree branch struck her in the buttock. Hematoma right gluteal region, imaging negative for acute findings. PMHx: anxiety  Clinical Impression  Pt is at or close to baseline functioning and should be safe at home. There are no further acute PT needs.  Will sign off at this time.        Recommendations for follow up therapy are one component of a multi-disciplinary discharge planning process, led by the attending physician.  Recommendations may be updated based on patient status, additional functional criteria and insurance authorization.  Follow Up Recommendations No PT follow up      Assistance Recommended at Discharge PRN  Patient can return home with the following       Equipment Recommendations None recommended by PT  Recommendations for Other Services       Functional Status Assessment Patient has had a recent decline in their functional status and demonstrates the ability to make significant improvements in function in a reasonable and predictable amount of time.     Precautions / Restrictions Precautions Precautions: Fall Restrictions Weight Bearing Restrictions: No      Mobility  Bed Mobility Overal bed mobility: Modified Independent                  Transfers Overall transfer level: Needs assistance Equipment used: None Transfers: Sit to/from Stand Sit to Stand: Supervision           General transfer comment: supervision for safety only    Ambulation/Gait Ambulation/Gait assistance: Independent Gait Distance (Feet): 300 Feet Assistive device: None Gait Pattern/deviations: Step-through pattern   Gait velocity interpretation: 1.31 - 2.62 ft/sec, indicative of limited community ambulator   General  Gait Details: steady, guarded, slower than baseline due to R hip pain  Stairs            Wheelchair Mobility    Modified Rankin (Stroke Patients Only)       Balance Overall balance assessment: Needs assistance Sitting-balance support: Feet supported Sitting balance-Leahy Scale: Good     Standing balance support: No upper extremity supported, During functional activity Standing balance-Leahy Scale: Fair                               Pertinent Vitals/Pain Pain Assessment Pain Assessment: Faces Faces Pain Scale: Hurts even more Pain Location: R hip Pain Descriptors / Indicators: Discomfort, Burning Pain Intervention(s): Monitored during session    Home Living Family/patient expects to be discharged to:: Private residence Living Arrangements: Spouse/significant other Available Help at Discharge: Available 24 hours/day Type of Home: House Home Access: Stairs to enter   [employer] of Steps: 2-3   Home Layout: One level Home Equipment: None Additional Comments: lives with boyfriend    Prior Function Prior Level of Function : Independent/Modified Independent;Driving;Working/employed             Mobility Comments: indep ADLs Comments: truck driver, owns horses     Hand Dominance   Dominant Hand: Right    Extremity/Trunk Assessment   Upper Extremity Assessment Upper Extremity Assessment: Overall WFL for tasks assessed    Lower Extremity Assessment Lower Extremity Assessment: Overall WFL for tasks assessed;RLE deficits/detail RLE Deficits / Details: R hip pain mildly limiting movement  Cervical / Trunk Assessment Cervical / Trunk Assessment: Normal  Communication   Communication: No difficulties  Cognition Arousal/Alertness: Awake/alert Behavior During Therapy: WFL for tasks assessed/performed Overall Cognitive Status: Within Functional Limits for tasks assessed                                           General Comments General comments (skin integrity, edema, etc.): vss, BP 127/80's after activity    Exercises     Assessment/Plan    PT Assessment Patient does not need any further PT services  PT Problem List         PT Treatment Interventions      PT Goals (Current goals can be found in the Care Plan section)  Acute Rehab PT Goals Patient Stated Goal: home to manage the horses PT Goal Formulation: With patient    Frequency       Co-evaluation               AM-PAC PT "6 Clicks" Mobility  Outcome Measure Help needed turning from your back to your side while in a flat bed without using bedrails?: None Help needed moving from lying on your back to sitting on the side of a flat bed without using bedrails?: None Help needed moving to and from a bed to a chair (including a wheelchair)?: None Help needed standing up from a chair using your arms (e.g., wheelchair or bedside chair)?: None Help needed to walk in hospital room?: None Help needed climbing 3-5 steps with a railing? : None 6 Click Score: 24    End of Session   Activity Tolerance: Patient tolerated treatment well Patient left: in bed;with call bell/phone within reach Nurse Communication: Mobility status PT Visit Diagnosis: Pain    Time: 1217-1232 PT Time Calculation (min) (ACUTE ONLY): 15 min   Charges:   PT Evaluation $PT Eval Low Complexity: 1 Low          01/15/2022  Ginger Carne., PT Acute Rehabilitation Services [phone]  (office)  Cynthia Richmond 01/15/2022, 12:37 PM

## 2022-01-15 NOTE — Evaluation (Signed)
Occupational Therapy Evaluation Patient Details Name: Cynthia Richmond MRN: [id number] DOB: 08/10/1965 Today's Date: 01/15/2022   History of Present Illness Cynthia Richmond is an 56 y.o. female who presented after sustaining an injury where a tree branch struck her in the buttock. Hematoma right gluteal region, imaging negative for acute findings. 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 Upon evaluation pt had functional limitations due to R posterior hip pain. Overall she was given generalized supervision A for safety only during mobility and ADLs. Pt did move slowly and guarded, no AD required, and pt reported improved pain and stiffness after ~25minutes of OOB functional activity.  She does not require further OT. Recommend d/c home with support of spouse at d/c.      Recommendations for follow up therapy are one component of a multi-disciplinary discharge planning process, led by the attending physician.  Recommendations may be updated based on patient status, additional functional criteria and insurance authorization.   Follow Up Recommendations  No OT follow up     Assistance Recommended at Discharge Intermittent Supervision/Assistance  Patient can return home with the following A little help with walking and/or transfers;A little help with bathing/dressing/bathroom;Assistance with cooking/housework;Assist for transportation;Help with stairs or ramp for entrance    Functional Status Assessment  Patient has had a recent decline in their functional status and demonstrates the ability to make significant improvements in function in a reasonable and predictable amount of time.  Equipment Recommendations  None recommended by OT       Precautions / Restrictions Precautions Precautions: Fall Restrictions Weight Bearing Restrictions: No      Mobility Bed  Mobility Overal bed mobility: Modified Independent                  Transfers Overall transfer level: Needs assistance Equipment used: None Transfers: Sit to/from Stand Sit to Stand: Supervision           General transfer comment: supervision for safety only      Balance Overall balance assessment: Needs assistance Sitting-balance support: Feet supported Sitting balance-Leahy Scale: Good     Standing balance support: No upper extremity supported, During functional activity Standing balance-Leahy Scale: Fair                             ADL either performed or assessed with clinical judgement   ADL Overall ADL's : Needs assistance/impaired Eating/Feeding: Independent;Sitting   Grooming: Supervision/safety;Standing   Upper Body Bathing: Set up;Sitting   Lower Body Bathing: Supervison/ safety;Sit to/from stand   Upper Body Dressing : Independent;Sitting   Lower Body Dressing: Supervision/safety;Sit to/from stand   Toilet Transfer: Armed forces technical officer Details (indicate cue type and reason): BSC used for urgency Toileting- Clothing Manipulation and Hygiene: Supervision/safety;Sit to/from stand       Functional mobility during ADLs: Supervision/safety General ADL Comments: generalized supervision for safety only. 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     Vision Baseline Vision/History: 0 No visual deficits Vision Assessment?: No apparent visual deficits     Perception Perception Perception Tested?: No   Praxis Praxis Praxis tested?: Within functional limits    Pertinent Vitals/Pain Pain Assessment Pain Assessment: 0-10 Pain Score: 4  Pain Location: R hip Pain Descriptors / Indicators: Discomfort, Burning Pain Intervention(s): Limited activity within patient's tolerance, Monitored during session     Hand  Dominance Right   Extremity/Trunk Assessment Upper Extremity Assessment Upper Extremity Assessment:  Overall WFL for tasks assessed   Lower Extremity Assessment Lower Extremity Assessment: Defer to PT evaluation   Cervical / Trunk Assessment Cervical / Trunk Assessment: Normal   Communication Communication Communication: No difficulties   Cognition Arousal/Alertness: Awake/alert Behavior During Therapy: WFL for tasks assessed/performed Overall Cognitive Status: Within Functional Limits for tasks assessed                                       General Comments  VSS on RA, pt reports her BP is soft normally. No significant change in vitals throughout            Home Living Family/patient expects to be discharged to:: Private residence Living Arrangements: Spouse/significant other Available Help at Discharge: Available 24 hours/day Type of Home: House Home Access: Stairs to enter [employer] of Steps: 2-3   Home Layout: One level     Bathroom Shower/Tub: Chief Strategy Officer: Handicapped height     Home Equipment: None   Additional Comments: lives with boyfriend      Prior Functioning/Environment Prior Level of Function : Independent/Modified Independent;Driving;Working/employed             Mobility Comments: indep ADLs Comments: truck driver, owns horses        OT Problem List: Decreased activity tolerance;Impaired balance (sitting and/or standing);Pain         OT Goals(Current goals can be found in the care plan section) Acute Rehab OT Goals Patient Stated Goal: home OT Goal Formulation: With patient Time For Goal Achievement: 01/15/22 Potential to Achieve Goals: Good   AM-PAC OT "6 Clicks" Daily Activity     Outcome Measure Help from another person eating meals?: None Help from another person taking care of personal grooming?: A Little Help from another person toileting, which includes using toliet, bedpan, or urinal?: A Little Help from another person bathing (including washing, rinsing, drying)?: A  Little Help from another person to put on and taking off regular upper body clothing?: None Help from another person to put on and taking off regular lower body clothing?: A Little 6 Click Score: 20   End of Session Nurse Communication: Mobility status  Activity Tolerance: Patient tolerated treatment well Patient left: in bed;with call bell/phone within reach  OT Visit Diagnosis: Unsteadiness on feet (R26.81);Pain                Time: 6063-0160 OT Time Calculation (min): 36 min Charges:  OT General Charges $OT Visit: 1 Visit OT Evaluation $OT Eval Low Complexity: 1 Low OT Treatments $Self Care/Home Management : 8-22 mins   Donia Pounds 01/15/2022, 9:25 AM

## 2022-01-16 LAB — CBC
HCT: 28.1 % — ABNORMAL LOW (ref 36.0–46.0)
Hemoglobin: 9.4 g/dL — ABNORMAL LOW (ref 12.0–15.0)
MCH: 32.5 pg (ref 26.0–34.0)
MCHC: 33.5 g/dL (ref 30.0–36.0)
MCV: 97.2 fL (ref 80.0–100.0)
Platelets: 146 10*3/uL — ABNORMAL LOW (ref 150–400)
RBC: 2.89 MIL/uL — ABNORMAL LOW (ref 3.87–5.11)
RDW: 13.2 % (ref 11.5–15.5)
WBC: 5.6 10*3/uL (ref 4.0–10.5)
nRBC: 0 % (ref 0.0–0.2)

## 2022-01-16 LAB — BASIC METABOLIC PANEL
Anion gap: 7 (ref 5–15)
BUN: 26 mg/dL — ABNORMAL HIGH (ref 6–20)
CO2: 24 mmol/L (ref 22–32)
Calcium: 8.6 mg/dL — ABNORMAL LOW (ref 8.9–10.3)
Chloride: 110 mmol/L (ref 98–111)
Creatinine, Ser: 0.95 mg/dL (ref 0.44–1.00)
GFR, Estimated: >60 mL/min (ref >60)
Glucose, Bld: 102 mg/dL — ABNORMAL HIGH (ref 70–99)
Potassium: 4.2 mmol/L (ref 3.5–5.1)
Sodium: 141 mmol/L (ref 135–145)

## 2022-01-16 LAB — HIV ANTIBODY (ROUTINE TESTING W REFLEX): HIV Screen 4th Generation wRfx: NONREACTIVE

## 2022-01-16 MED ORDER — OXYCODONE HCL 5 MG PO TABS: 5.0000 mg | ORAL_TABLET | Freq: Four times a day (QID) | ORAL | 0 refills | Status: AC | PRN

## 2022-01-16 NOTE — Progress Notes (Signed)
  Transition of Care [facility]) Screening Note   Patient Details  Name: Cynthia Richmond Date of Birth: 1965-03-23   Transition of Care [facility]) CM/SW Contact:    Bess Kinds, RN Phone Number: [phone] 01/16/2022, 10:39 AM  Chart reviewed. Patient to transition home today. No TOC needs identified.   Transition of Care Department [facility]) has reviewed patient and no TOC needs have been identified at this time. We will continue to monitor patient advancement through interdisciplinary progression rounds. If new patient transition needs arise, please place a TOC consult.

## 2022-01-16 NOTE — Plan of Care (Signed)

## 2022-01-16 NOTE — Plan of Care (Signed)

## 2022-01-16 NOTE — Discharge Summary (Signed)
Physician Discharge Summary  Patient ID: Cynthia Richmond MRN: [id number] DOB/AGE: 1965-07-31 56 y.o.  PCP: April Manson, NP  Admit date: 01/14/2022 Discharge date: 01/16/2022  Admission Diagnoses:  tree fell on right back  Discharge Diagnoses:  large hematoma on the right buttock  Principal Problem:   Hematoma   Surgery:  none  Discharged Condition: improved  Hospital Course:   Admitted for pain control and observation.  Hg drift down with large hemtoma  Consults: none  Significant Diagnostic Studies: CBC    Discharge Exam: Blood pressure 105/75, pulse 60, temperature 98.2 F (36.8 C), temperature source Oral, resp. rate 19, height 5\' 11"  (1.803 m), weight 82.6 kg, SpO2 100 %. Skin intact; not much visible bruising but suspect will be more apparent later  Disposition: Discharge disposition: 01-Home or Self Care       Discharge Instructions     Call MD for:  persistant dizziness or light-headedness   Complete by: As directed    Call MD for:  redness, tenderness, or signs of infection (pain, swelling, redness, odor or green/yellow discharge around incision site)   Complete by: As directed    Diet - low sodium heart healthy   Complete by: As directed    Discharge instructions   Complete by: As directed    Stay well hydrated Your Hg dropped with this injury and expect a large bruise to slowly resolve.   Discharge wound care:   Complete by: As directed    Use ice pack as needed for pain   Increase activity slowly   Complete by: As directed       Allergies as of 01/16/2022       Reactions   Hydrocodone Itching   Codeine Itching, Other (See Comments)   Other reaction(s): Unknown Other reaction(s): Itching        Medication List     STOP taking these medications    BIOTIN 5000 PO       TAKE these medications    acetaminophen 500 MG tablet Commonly known as: TYLENOL Take 1,000 mg by mouth daily as needed for moderate pain.    buPROPion 150 MG 12 hr tablet Commonly known as: WELLBUTRIN SR Take 150 mg by mouth daily.   busPIRone 15 MG tablet Commonly known as: BUSPAR Take 15 mg by mouth 2 (two) times daily.   cholecalciferol 25 MCG (1000 UNIT) tablet Commonly known as: VITAMIN D3 Take 1,000 Units by mouth daily.   citalopram 40 MG tablet Commonly known as: CELEXA Take 20 mg by mouth daily.   loratadine 10 MG tablet Commonly known as: CLARITIN Take 10 mg by mouth daily.   meloxicam 15 MG tablet Commonly known as: MOBIC Take 15 mg by mouth daily.   oxybutynin 10 MG 24 hr tablet Commonly known as: DITROPAN-XL Take 10 mg by mouth daily.   oxyCODONE 5 MG immediate release tablet Commonly known as: Oxy IR/ROXICODONE Take 1 tablet (5 mg total) by mouth every 6 (six) hours as needed for severe pain.               Discharge Care Instructions  (From admission, onward)           Start     Ordered   01/16/22 0000  Discharge wound care:       Comments: Use ice pack as needed for pain   01/16/22 0940            Follow-up Information     01/18/22  L, NP Follow up in 1 week(s).   Specialty: Family Medicine Contact information: [address] [place] [address] [phone]                 Signed: Valarie Merino 01/16/2022, 9:43 AM

## 2022-01-16 NOTE — Discharge Instructions (Signed)
Would check with your PCP this week and have her recheck your Hemoglobin.

## 2022-01-16 NOTE — TOC CAGE-AID Note (Signed)
Transition of Care [facility]) - CAGE-AID Screening  Patient Details  Name: Cynthia Richmond MRN: [id number] Date of Birth: 08/12/1965  Clinical Narrative:  Patient denies any substance abuse problems, no need for education at this time.  CAGE-AID Screening:   Have You Ever Felt You Ought to Cut Down on Your Drinking or Drug Use?: No Have People Annoyed You By Critizing Your Drinking Or Drug Use?: No Have You Felt Bad Or Guilty About Your Drinking Or Drug Use?: No Have You Ever Had a Drink or Used Drugs First Thing In The Morning to Steady Your Nerves or to Get Rid of a Hangover?: No CAGE-AID Score: 0  Substance Abuse Education Offered: No
